# Patient Record
Sex: Female | Born: 1955 | Race: Black or African American | Hispanic: No | Smoking: Never smoker
Health system: Southern US, Community
[De-identification: ages and names within clinical notes are randomized; demographics above are authoritative.]

---

## 2014-06-01 ENCOUNTER — Emergency Department (HOSPITAL_COMMUNITY)
Admission: EM | Admit: 2014-06-01 | Discharge: 2014-06-01 | Disposition: A | Discharge status: Home / Self Care | Payer: Self-pay | Attending: Emergency Medicine | Admitting: Emergency Medicine

## 2014-06-01 ENCOUNTER — Emergency Department (HOSPITAL_COMMUNITY): Payer: Self-pay

## 2014-06-01 ENCOUNTER — Encounter (HOSPITAL_COMMUNITY): Payer: Self-pay | Admitting: Emergency Medicine

## 2014-06-01 DIAGNOSIS — M79605 Pain in left leg: Secondary | ICD-10-CM

## 2014-06-01 DIAGNOSIS — Y9289 Other specified places as the place of occurrence of the external cause: Secondary | ICD-10-CM | POA: Insufficient documentation

## 2014-06-01 DIAGNOSIS — W108XXA Fall (on) (from) other stairs and steps, initial encounter: Secondary | ICD-10-CM | POA: Insufficient documentation

## 2014-06-01 DIAGNOSIS — S8992XA Unspecified injury of left lower leg, initial encounter: Secondary | ICD-10-CM | POA: Insufficient documentation

## 2014-06-01 DIAGNOSIS — Y9389 Activity, other specified: Secondary | ICD-10-CM | POA: Insufficient documentation

## 2014-06-01 DIAGNOSIS — Y998 Other external cause status: Secondary | ICD-10-CM | POA: Insufficient documentation

## 2014-06-01 DIAGNOSIS — M25562 Pain in left knee: Secondary | ICD-10-CM

## 2014-06-01 MED ORDER — HYDROMORPHONE HCL 1 MG/ML IJ SOLN
1.0000 mg | Freq: Once | INTRAMUSCULAR | Status: AC
  Administered 2014-06-01: 1 mg via INTRAMUSCULAR
  Filled 2014-06-01: qty 1

## 2014-06-01 MED ORDER — HYDROCODONE-ACETAMINOPHEN 5-325 MG PO TABS: 1.0000 | ORAL_TABLET | ORAL | Status: AC | PRN

## 2014-06-01 NOTE — ED Notes (Signed)
Per EMS: Pt from home for eval of left leg pain that has gotten worse since a fall x2 weeks ago, pt reports she tripped and fell walking up the stairs. Pt reports that she has not been able to bear weight on that leg since Wednesday. EMS noted no obvious deformity, no warmth noted to leg. Pt with no hx of DVT. Pt tearful upon arrival.

## 2014-06-01 NOTE — ED Provider Notes (Signed)
CSN: [id number]     Arrival date & time 06/01/14  1732 History   First MD Initiated Contact with Patient 06/01/14 1740     Chief Complaint  Patient presents with  . Leg Pain     (Consider location/radiation/quality/duration/timing/severity/associated sxs/prior Treatment) Patient is a 59 y.o. female presenting with leg pain.  Leg Pain Location:  Leg (knee and surrounding area) Time since incident:  2 weeks (1 wk per pt, family reports longer) Injury: yes   Mechanism of injury: fall   Fall:    Fall occurred:  Tripped (up the stairs)   Height of fall:  From standing onto steps with knee hitting steps   Point of impact:  Knees   Entrapped after fall: no   Leg location:  L leg Pain details:    Quality:  Aching   Radiates to:  Does not radiate   Severity:  Severe   Onset quality:  Gradual   Duration:  3 days (has been hurting since fall but worsened 3 days ago)   Timing:  Constant   Progression:  Worsening Chronicity:  New Dislocation: no   Foreign body present:  No foreign bodies Prior injury to area:  No Relieved by:  Nothing Worsened by:  Bearing weight, exercise, flexion and rotation Ineffective treatments:  None tried Associated symptoms: decreased ROM (pain), stiffness and swelling   Associated symptoms: no back pain, no fever, no muscle weakness, no neck pain and no numbness   Risk factors: obesity     History reviewed. No pertinent past medical history. History reviewed. No pertinent past surgical history. No family history on file. History  Substance Use Topics  . Smoking status: Never Smoker   . Smokeless tobacco: Not on file  . Alcohol Use: Yes   OB History    No data available     Review of Systems  Constitutional: Negative for fever.  HENT: Negative for sore throat.   Eyes: Negative for visual disturbance.  Respiratory: Negative for cough and shortness of breath.   Cardiovascular: Negative for chest pain.  Gastrointestinal: Negative for nausea,  vomiting and abdominal pain.  Genitourinary: Negative for difficulty urinating.  Musculoskeletal: Positive for arthralgias and stiffness. Negative for back pain and neck pain.  Skin: Negative for rash.  Neurological: Negative for syncope, weakness, numbness and headaches.      Allergies  Review of patient's allergies indicates no known allergies.  Home Medications   Prior to Admission medications   Not on File   BP 160/71 mmHg  Pulse 66  Temp(Src) 98.1 F (36.7 C) (Oral)  Resp 18  Ht 5\' 7"  (1.702 m)  SpO2 95% Physical Exam  Constitutional: She is oriented to person, place, and time. She appears well-developed and well-nourished. No distress.  HENT:  Head: Normocephalic and atraumatic.  Eyes: Conjunctivae and EOM are normal.  Neck: Normal range of motion.  Cardiovascular: Normal rate, regular rhythm, normal heart sounds and intact distal pulses.  Exam reveals no gallop and no friction rub.   No murmur heard. Pulmonary/Chest: Effort normal and breath sounds normal. No respiratory distress. She has no wheezes. She has no rales.  Abdominal: Soft. She exhibits no distension. There is no tenderness. There is no guarding.  Musculoskeletal: She exhibits no edema.       Left hip: She exhibits decreased range of motion (reports secondary to knee pain), decreased strength (reports secondary to pain), tenderness and bony tenderness.       Left knee: She exhibits decreased range  of motion (reports secondary to pain). She exhibits no effusion, no ecchymosis, no deformity, no laceration and no erythema. Tenderness found. Medial joint line and lateral joint line tenderness noted.  Exam limited by body habitus  Neurological: She is alert and oriented to person, place, and time. She has normal strength.  Strength 5/5 in areas except left lower ext which is limited by pain per pt  Skin: Skin is warm and dry. No rash noted. She is not diaphoretic. No erythema.  Nursing note and vitals  reviewed.   ED Course  Procedures (including critical care time) Labs Review Labs Reviewed - No data to display  Imaging Review Dg Tibia/fibula Left  06/01/2014   CLINICAL DATA:  Initial evaluation for left leg pain after fall 2 weeks ago  EXAM: LEFT TIBIA AND FIBULA - 2 VIEW  COMPARISON:  None.  FINDINGS: There is no evidence of fracture or other focal bone lesions. Soft tissues are unremarkable.  IMPRESSION: Negative.   Electronically Signed   By: Esperanza Heir M.D.   On: 06/01/2014 18:49   Dg Knee Complete 4 Views Left  06/01/2014   CLINICAL DATA:  Initial evaluation left knee pain after fall 2 weeks ago  EXAM: LEFT KNEE - COMPLETE 4+ VIEW  COMPARISON:  None.  FINDINGS: There is no evidence of fracture, dislocation, or joint effusion. There is no evidence of arthropathy or other focal bone abnormality. Soft tissues are unremarkable.  IMPRESSION: Negative.   Electronically Signed   By: Esperanza Heir M.D.   On: 06/01/2014 18:48     EKG Interpretation None      MDM   Final diagnoses:  None   59 year old female doesn't begin medical history presents with concern of left knee pain.  Patient reports a fall that she believes is one week ago however her daughters feel that it was longer. Reports that she fell onto her left knee while walking up the stairs and since then she has an aching pain in her left knee however it worsened in the last several days.  Given history of trauma without any asymmetric leg swelling or history of DVT have low suspicion that pain is from DVT. X-rays were done of patient's left knee, tib-fib and hip which show no sign of fracture. Patient without any erythema, effusion, or fevers and have low suspicion for a septic arthritis.  She has pain with active and passive range of motion as well as palpation and may have been other musculoskeletal or ligamentous injury.  Given norco for pain, crutches for assistance with ambulation and recommended follow up with  Orthopedics for further evaluation and provided number for PCP evaluation.       Rhae Lerner, MD 06/01/14 2129  Rolland Porter, MD 06/11/14 [phone]

## 2014-06-01 NOTE — ED Notes (Signed)
EDP and resident MD at bedside.

## 2014-06-01 NOTE — ED Notes (Signed)
Resident at bedside.  

## 2014-06-01 NOTE — Discharge Instructions (Signed)

## 2016-07-21 IMAGING — DX DG KNEE COMPLETE 4+V*L*
4 series · 4 of 4 positions shown · non-contrast
Comparison: None.

CLINICAL DATA: Initial evaluation left knee pain after fall 2 weeks
ago

EXAM:
LEFT KNEE - COMPLETE 4+ VIEW

[knee ap]
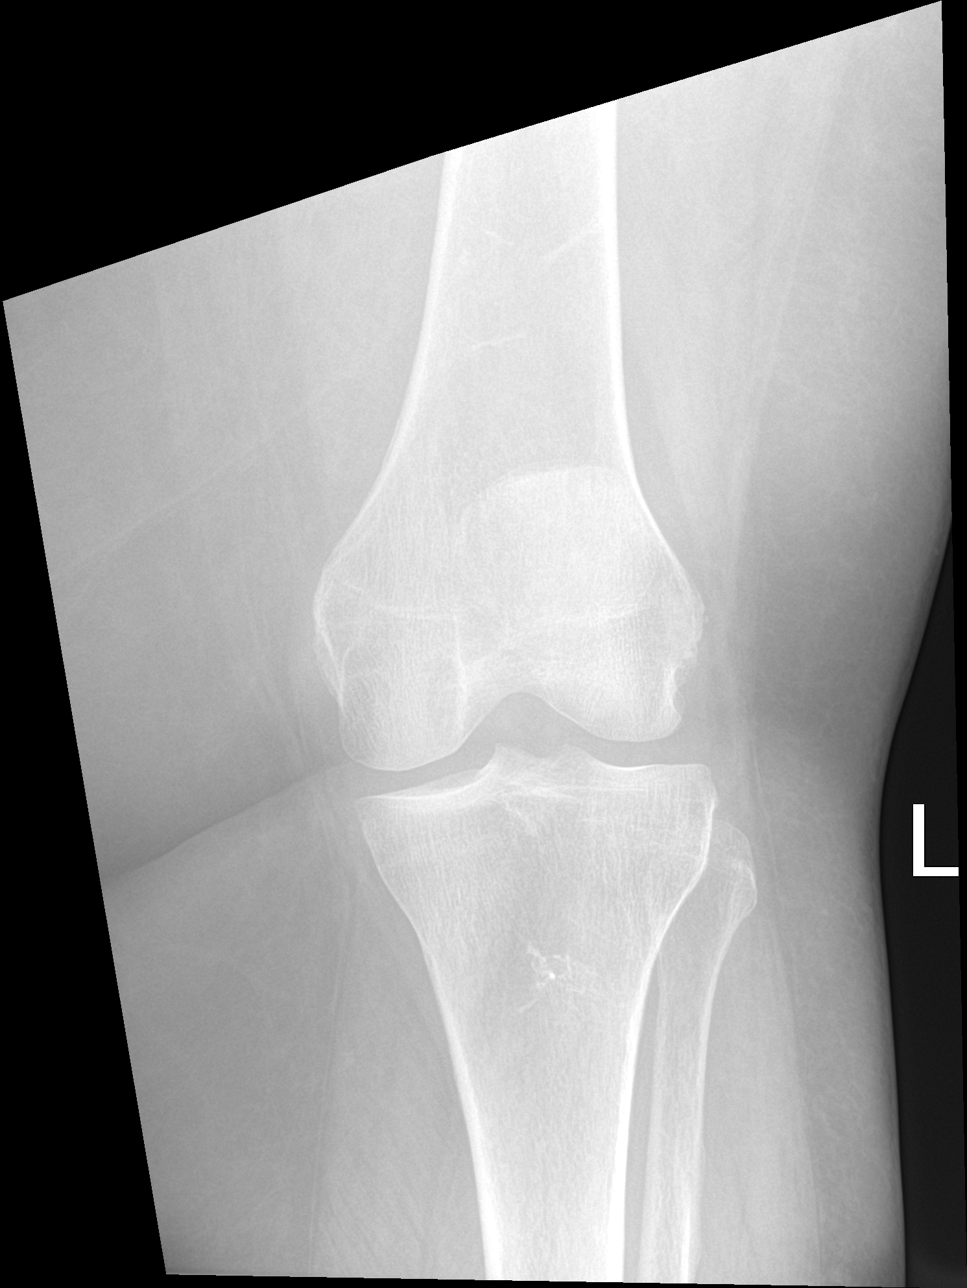

[knee obl (1 of 2)]
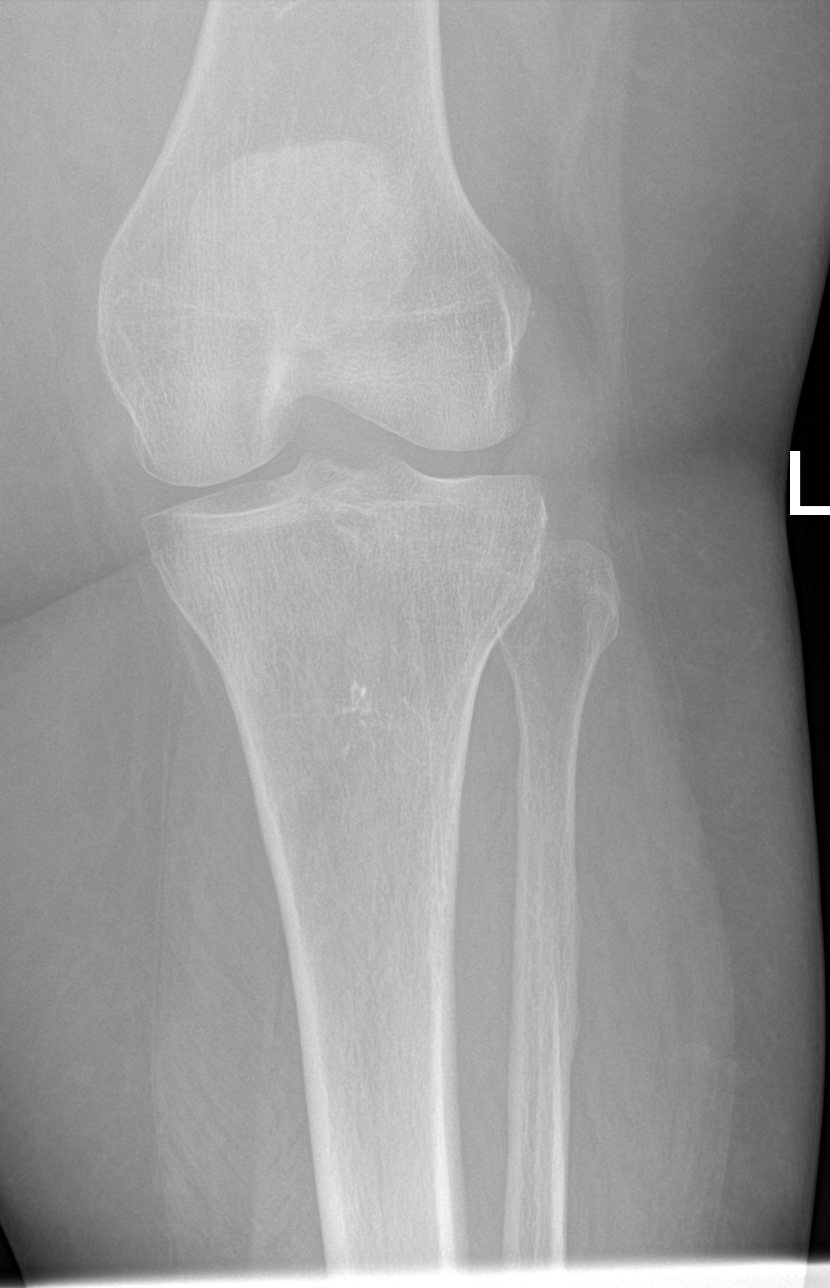

[knee obl (2 of 2)]
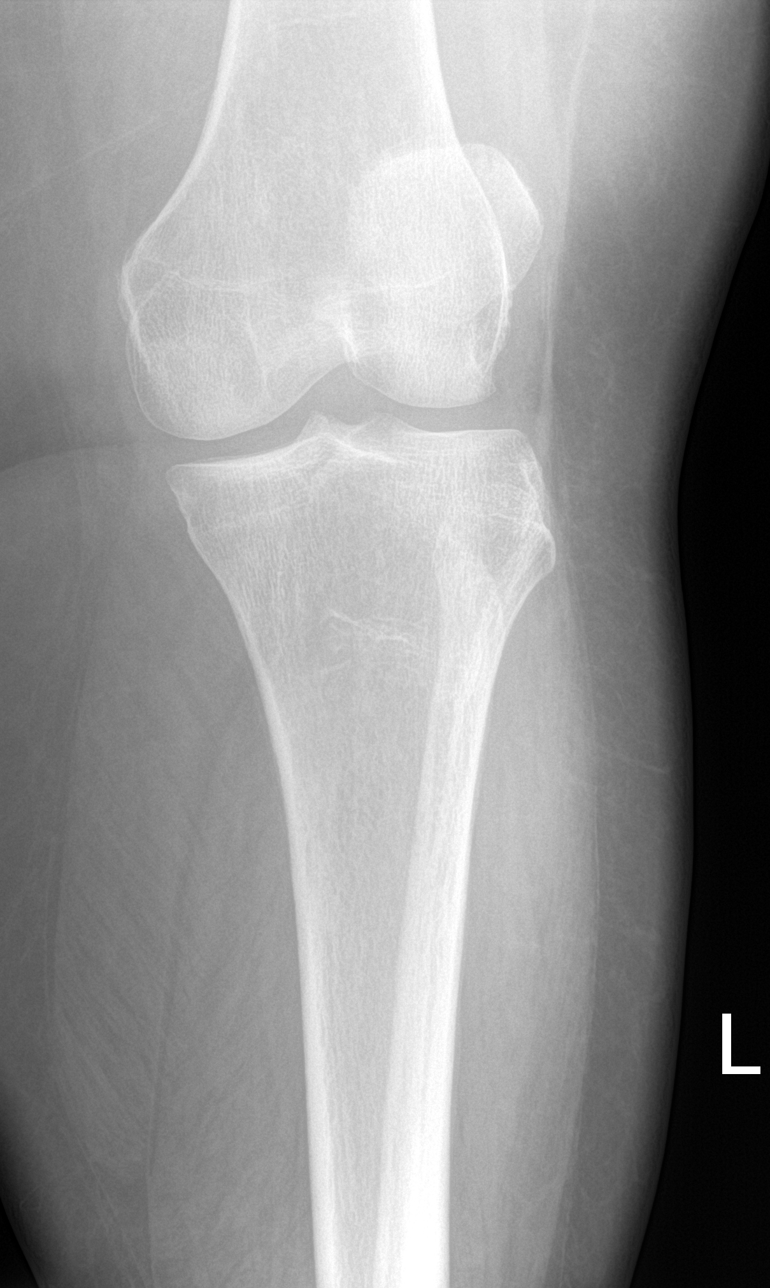

[knee lat]
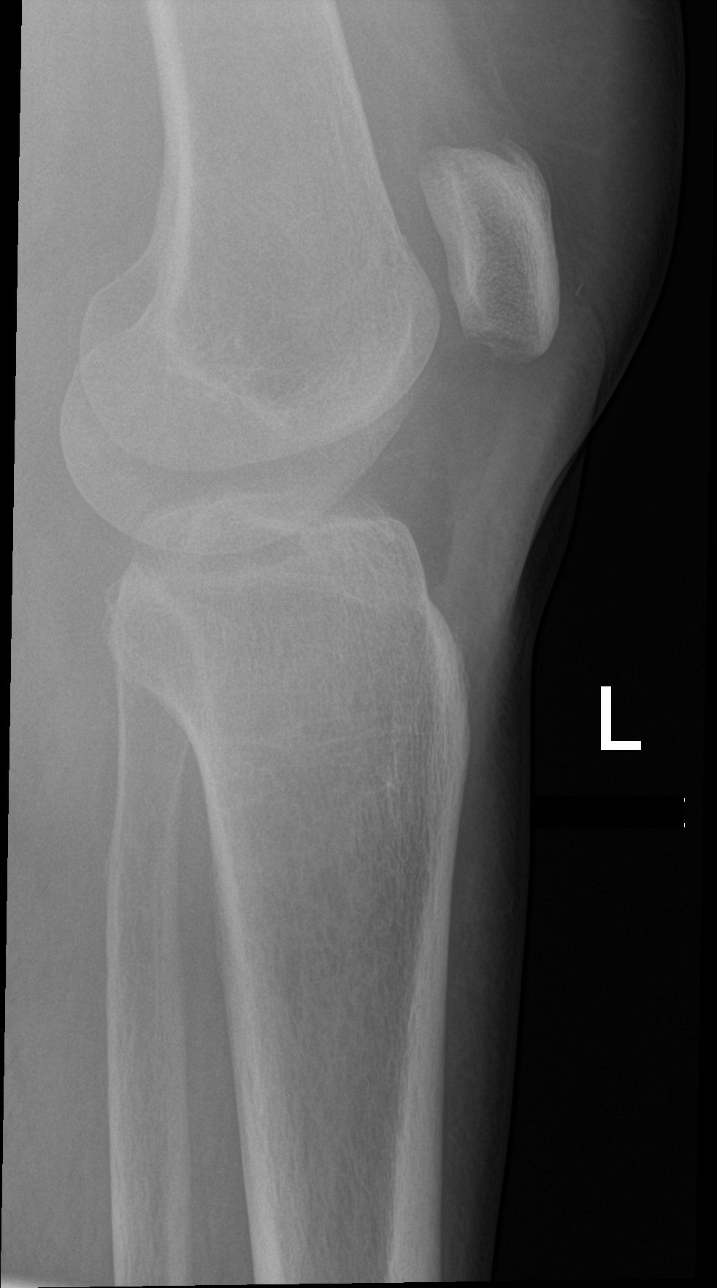

[4 of 4 positions shown; findings below may reference images not displayed]

FINDINGS: There is no evidence of fracture, dislocation, or joint effusion.
There is no evidence of arthropathy or other focal bone abnormality.
Soft tissues are unremarkable.
IMPRESSION: Negative.

## 2019-02-22 DEATH — deceased
